# Patient Record
Sex: Female | Born: 2000 | Race: White | Hispanic: No | Marital: Single | Smoking: Never smoker
Health system: Southern US, Community
[De-identification: ages and names within clinical notes are randomized; demographics above are authoritative.]

## PROBLEM LIST (undated history)

## (undated) DIAGNOSIS — E119 Type 2 diabetes mellitus without complications: Secondary | ICD-10-CM

## (undated) HISTORY — DX: Type 2 diabetes mellitus without complications: E11.9

---
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
## 2018-06-05 ENCOUNTER — Encounter: Payer: Self-pay | Admitting: Certified Nurse Midwife

## 2018-06-05 ENCOUNTER — Ambulatory Visit: Payer: Managed Care, Other (non HMO) | Admitting: Certified Nurse Midwife

## 2018-06-05 ENCOUNTER — Other Ambulatory Visit: Payer: Self-pay

## 2018-06-05 VITALS — BP 128/86 | HR 88 | Resp 16 | Ht 63.75 in | Wt 132.6 lb

## 2018-06-05 DIAGNOSIS — N898 Other specified noninflammatory disorders of vagina: Secondary | ICD-10-CM

## 2018-06-05 DIAGNOSIS — R319 Hematuria, unspecified: Secondary | ICD-10-CM

## 2018-06-05 DIAGNOSIS — N390 Urinary tract infection, site not specified: Secondary | ICD-10-CM

## 2018-06-05 DIAGNOSIS — Z113 Encounter for screening for infections with a predominantly sexual mode of transmission: Secondary | ICD-10-CM | POA: Diagnosis not present

## 2018-06-05 DIAGNOSIS — R300 Dysuria: Secondary | ICD-10-CM

## 2018-06-05 LAB — POCT URINALYSIS DIPSTICK
BILIRUBIN UA: NEGATIVE
GLUCOSE UA: POSITIVE — AB
Leukocytes, UA: NEGATIVE
Nitrite, UA: NEGATIVE
Protein, UA: NEGATIVE
Urobilinogen, UA: 0.2 E.U./dL
pH, UA: 5 (ref 5.0–8.0)

## 2018-06-05 MED ORDER — NITROFURANTOIN MONOHYD MACRO 100 MG PO CAPS: 100.0000 mg | ORAL_CAPSULE | Freq: Two times a day (BID) | ORAL | 0 refills | Status: DC

## 2018-06-05 NOTE — Progress Notes (Signed)
18 y.o. Single Caucasian female G0P0000 here with complaint of UTI, with onset  on the past 2 days.. Patient complaining of urinary frequency and vaginal irritation and slight  pain with urination. Patient denies fever, chills, nausea or back pain. No new personal products. Patient feels related to sexual activity with new partner. Complaining of vaginal irritation with increase white discharge. No odor. Desires STD screening.  Contraception is [place] IUD.Jillian Barton Patient feels she is not consuming adequate water intake. Drinking increased diet cokes. Patient is insulin dependent diabetic and struggling with managing at college now([employer]). Forgot to take afternoon  Insulin and feel this is why glucose in urine is high, also ate small snack only. Aware of what she needs to do and is working on a routine. No other health issues today.  Review of Systems  Genitourinary: Positive for dysuria, frequency and urgency.  All other systems reviewed and are negative.  Urine RBC: small, Ketones moderate, Glucose 1000+ O: Healthy female WDWN Affect: Normal, orientation x 3 Skin : warm and dry CVAT: negative bilateral Abdomen: very slight  suprapubic tenderness  Pelvic exam: External genital area:slight increase pink on vulva, no scaling or exudate no lesions Bladder,Urethra non tender, Urethral meatus: tender, redness noted Vagina: white non odorous vaginal discharge, normal appearance  Affirm takne Cervix: normal, non tender Uterus:normal,non tender Adnexa: normal non tender, no fullness or masses   A: Suspect post coital UTI Contraception Kyleena IUD R/O vaginal infection STD screening Normal pelvic exam Insulin dependent diabetic working on staying stable with college schedule  P: Reviewed findings of UTI suspect coital related and need for treatment, at this point due to finding. Be sure and empty bladder before and after sexual activity to prevent UTI occurrence. Condom use recommended GB:[id number] 100  mg bid x 7 see order with instructions VPX:TGGYI micro, culture Reviewed warning signs and symptoms of UTI and need to advise if occurring. Encouraged to limit soda, tea, and coffee and be sure to increase water intake. Lab: Affirm will treat if indicated Lab: Gc,chlamydia  Rv prn

## 2018-06-05 NOTE — Patient Instructions (Signed)
Urinary Tract Infection, Adult A urinary tract infection (UTI) is an infection of any part of the urinary tract. The urinary tract includes:  The kidneys.  The ureters.  The bladder.  The urethra. These organs make, store, and get rid of pee (urine) in the body. What are the causes? This is caused by germs (bacteria) in your genital area. These germs grow and cause swelling (inflammation) of your urinary tract. What increases the risk? You are more likely to develop this condition if:  You have a small, thin tube (catheter) to drain pee.  You cannot control when you pee or poop (incontinence).  You are female, and: ? You use these methods to prevent pregnancy: ? A medicine that kills sperm (spermicide). ? A device that blocks sperm (diaphragm). ? You have low levels of a female hormone (estrogen). ? You are pregnant.  You have genes that add to your risk.  You are sexually active.  You take antibiotic medicines.  You have trouble peeing because of: ? A prostate that is bigger than normal, if you are female. ? A blockage in the part of your body that drains pee from the bladder (urethra). ? A kidney stone. ? A nerve condition that affects your bladder (neurogenic bladder). ? Not getting enough to drink. ? Not peeing often enough.  You have other conditions, such as: ? Diabetes. ? A weak disease-fighting system (immune system). ? Sickle cell disease. ? Gout. ? Injury of the spine. What are the signs or symptoms? Symptoms of this condition include:  Needing to pee right away (urgently).  Peeing often.  Peeing small amounts often.  Pain or burning when peeing.  Blood in the pee.  Pee that smells bad or not like normal.  Trouble peeing.  Pee that is cloudy.  Fluid coming from the vagina, if you are female.  Pain in the belly or lower back. Other symptoms include:  Throwing up (vomiting).  No urge to eat.  Feeling mixed up (confused).  Being tired  and grouchy (irritable).  A fever.  Watery poop (diarrhea). How is this treated? This condition may be treated with:  Antibiotic medicine.  Other medicines.  Drinking enough water. Follow these instructions at home:  Medicines  Take over-the-counter and prescription medicines only as told by your doctor.  If you were prescribed an antibiotic medicine, take it as told by your doctor. Do not stop taking it even if you start to feel better. General instructions  Make sure you: ? Pee until your bladder is empty. ? Do not hold pee for a long time. ? Empty your bladder after sex. ? Wipe from front to back after pooping if you are a female. Use each tissue one time when you wipe.  Drink enough fluid to keep your pee pale yellow.  Keep all follow-up visits as told by your doctor. This is important. Contact a doctor if:  You do not get better after 1-2 days.  Your symptoms go away and then come back. Get help right away if:  You have very bad back pain.  You have very bad pain in your lower belly.  You have a fever.  You are sick to your stomach (nauseous).  You are throwing up. Summary  A urinary tract infection (UTI) is an infection of any part of the urinary tract.  This condition is caused by germs in your genital area.  There are many risk factors for a UTI. These include having a small, thin   tube to drain pee and not being able to control when you pee or poop.  Treatment includes antibiotic medicines for germs.  Drink enough fluid to keep your pee pale yellow. This information is not intended to replace advice given to you by your health care provider. Make sure you discuss any questions you have with your health care provider. Document Released: 10/18/2007 Document Revised: 11/08/2017 Document Reviewed: 11/08/2017 Elsevier Interactive Patient Education  2019 Elsevier Inc.  

## 2018-06-06 ENCOUNTER — Telehealth: Payer: Self-pay

## 2018-06-06 LAB — VAGINITIS/VAGINOSIS, DNA PROBE
Candida Species: NEGATIVE
Gardnerella vaginalis: NEGATIVE
Trichomonas vaginosis: NEGATIVE

## 2018-06-06 LAB — URINALYSIS, MICROSCOPIC ONLY
Bacteria, UA: NONE SEEN
Casts: NONE SEEN /lpf

## 2018-06-06 NOTE — Telephone Encounter (Signed)
-----   Message from Verner Chol, CNM sent at 06/06/2018 12:22 PM EST ----- Notify patient her vaginal screen was negative for infection with yeast, BV or trichomonas Urine culture pending Patient status and is she taking her insulin

## 2018-06-06 NOTE — Telephone Encounter (Signed)
Left message for call back.

## 2018-06-06 NOTE — Telephone Encounter (Signed)
Patient returning call.

## 2018-06-06 NOTE — Telephone Encounter (Signed)
Patient notified of results. See lab 

## 2018-06-07 LAB — GC/CHLAMYDIA PROBE AMP
CHLAMYDIA, DNA PROBE: NEGATIVE
NEISSERIA GONORRHOEAE BY PCR: NEGATIVE

## 2018-06-07 LAB — URINE CULTURE

## 2019-02-21 ENCOUNTER — Telehealth: Payer: Self-pay | Admitting: Obstetrics & Gynecology

## 2019-02-21 NOTE — Telephone Encounter (Signed)
Patient is having vaginal irritation and would like to see Dr.Miller if possible. To triage to assist with scheduling.

## 2019-02-21 NOTE — Telephone Encounter (Signed)
Spoke with patient. Patient reports external vaginal irritation with itching. Kyleena IUD, has not had a regular menses since the summer, reports spotting only. Increase in menses cramps, denies pain at this time, cramping is intermittent. Denies urinary symptoms, fever/chills, N/V. Covid 19 prescreen negative. States she has not been sexually active since " early springtime". Requesting OV. Patient is agreeable to seeing covering provider, OV scheduled for 10/12 at 11:30am with Dr. Talbert Nan. Advised provider will review, our office will return call if any additional recommendations. Patient agreeable.   Routing to provider for final review. Patient is agreeable to disposition. Will close encounter.  Cc: Dr. Talbert Nan

## 2019-02-21 NOTE — Telephone Encounter (Signed)
Call to patient, left detailed message, ok per dpr. Advised appt for 10/12 changed from 11:30am to 2pm with Dr. Talbert Nan. Please contact the office if unable to keep appt.

## 2019-02-24 ENCOUNTER — Ambulatory Visit: Payer: Managed Care, Other (non HMO) | Admitting: Obstetrics and Gynecology

## 2019-02-24 ENCOUNTER — Other Ambulatory Visit: Payer: Self-pay

## 2019-02-24 ENCOUNTER — Encounter: Payer: Self-pay | Admitting: Obstetrics and Gynecology

## 2019-02-24 VITALS — BP 112/82 | HR 68 | Temp 97.2°F | Wt 134.8 lb

## 2019-02-24 DIAGNOSIS — Z30431 Encounter for routine checking of intrauterine contraceptive device: Secondary | ICD-10-CM

## 2019-02-24 DIAGNOSIS — N946 Dysmenorrhea, unspecified: Secondary | ICD-10-CM | POA: Diagnosis not present

## 2019-02-24 DIAGNOSIS — N939 Abnormal uterine and vaginal bleeding, unspecified: Secondary | ICD-10-CM

## 2019-02-24 DIAGNOSIS — B373 Candidiasis of vulva and vagina: Secondary | ICD-10-CM

## 2019-02-24 DIAGNOSIS — B3731 Acute candidiasis of vulva and vagina: Secondary | ICD-10-CM

## 2019-02-24 LAB — POCT URINE PREGNANCY: Preg Test, Ur: NEGATIVE

## 2019-02-24 MED ORDER — FLUCONAZOLE 150 MG PO TABS: 150.0000 mg | ORAL_TABLET | Freq: Once | ORAL | 0 refills | Status: AC

## 2019-02-24 MED ORDER — BETAMETHASONE VALERATE 0.1 % EX OINT: 1.0000 "application " | TOPICAL_OINTMENT | Freq: Two times a day (BID) | CUTANEOUS | 0 refills | Status: DC

## 2019-02-24 NOTE — Patient Instructions (Signed)
Vaginal Yeast infection, Adult  Vaginal yeast infection is a condition that causes vaginal discharge as well as soreness, swelling, and redness (inflammation) of the vagina. This is a common condition. Some women get this infection frequently. What are the causes? This condition is caused by a change in the normal balance of the yeast (candida) and bacteria that live in the vagina. This change causes an overgrowth of yeast, which causes the inflammation. What increases the risk? The condition is more likely to develop in women who:  Take antibiotic medicines.  Have diabetes.  Take birth control pills.  Are pregnant.  Douche often.  Have a weak body defense system (immune system).  Have been taking steroid medicines for a long time.  Frequently wear tight clothing. What are the signs or symptoms? Symptoms of this condition include:  White, thick, creamy vaginal discharge.  Swelling, itching, redness, and irritation of the vagina. The lips of the vagina (vulva) may be affected as well.  Pain or a burning feeling while urinating.  Pain during sex. How is this diagnosed? This condition is diagnosed based on:  Your medical history.  A physical exam.  A pelvic exam. Your health care provider will examine a sample of your vaginal discharge under a microscope. Your health care provider may send this sample for testing to confirm the diagnosis. How is this treated? This condition is treated with medicine. Medicines may be over-the-counter or prescription. You may be told to use one or more of the following:  Medicine that is taken by mouth (orally).  Medicine that is applied as a cream (topically).  Medicine that is inserted directly into the vagina (suppository). Follow these instructions at home:  Lifestyle  Do not have sex until your health care provider approves. Tell your sex partner that you have a yeast infection. That person should go to his or her health care  provider and ask if they should also be treated.  Do not wear tight clothes, such as pantyhose or tight pants.  Wear breathable cotton underwear. General instructions  Take or apply over-the-counter and prescription medicines only as told by your health care provider.  Eat more yogurt. This may help to keep your yeast infection from returning.  Do not use tampons until your health care provider approves.  Try taking a sitz bath to help with discomfort. This is a warm water bath that is taken while you are sitting down. The water should only come up to your hips and should cover your buttocks. Do this 3-4 times per day or as told by your health care provider.  Do not douche.  If you have diabetes, keep your blood sugar levels under control.  Keep all follow-up visits as told by your health care provider. This is important. Contact a health care provider if:  You have a fever.  Your symptoms go away and then return.  Your symptoms do not get better with treatment.  Your symptoms get worse.  You have new symptoms.  You develop blisters in or around your vagina.  You have blood coming from your vagina and it is not your menstrual period.  You develop pain in your abdomen. Summary  Vaginal yeast infection is a condition that causes discharge as well as soreness, swelling, and redness (inflammation) of the vagina.  This condition is treated with medicine. Medicines may be over-the-counter or prescription.  Take or apply over-the-counter and prescription medicines only as told by your health care provider.  Do not douche.   Do not have sex or use tampons until your health care provider approves.  Contact a health care provider if your symptoms do not get better with treatment or your symptoms go away and then return. This information is not intended to replace advice given to you by your health care provider. Make sure you discuss any questions you have with your health care  provider. Document Released: 02/08/2005 Document Revised: 09/17/2017 Document Reviewed: 09/17/2017 Elsevier Patient Education  2020 Elsevier Inc.  

## 2019-02-24 NOTE — Progress Notes (Signed)
GYNECOLOGY  VISIT   HPI: 18 y.o.   Single White or Caucasian Not Hispanic or Latino  female   G0P0000 with No LMP recorded. (Menstrual status: IUD).   here for irregular bleeding and cramping with IUD. The patient has a [place] IUD, placed in 6/18. She has spotting most months, can last for 5 days. She has cramps for a few days prior to bleeding, mostly tolerable, occasional bad. 1 day of the month she has waves of cramping that gets to a 7-8/10 in severity. She had really severe cramps prior to the IUD, overall the cramps with the IUD are better than without an IUD. They have progressively gotten a little worse, her eating habits and activity level have changed with covid. Sexually active, but not for 6 months, always uses condoms.   Reports vaginal irritation intermittently for months, seems cyclic. No abnormal vaginal d/c, feels itchy and raw, no odor.   Type I DM, reports last HgbA1C ~7  GYNECOLOGIC HISTORY: No LMP recorded. (Menstrual status: IUD). Contraception: IUD Menopausal hormone therapy: IUD        OB History    Gravida  0   Para  0   Term  0   Preterm  0   AB  0   Living  0     SAB  0   TAB  0   Ectopic  0   Multiple  0   Live Births  0              Patient Active Problem List   Diagnosis Date Noted  . IUD (intrauterine device) in place 12/15/2016  . Type 1 diabetes mellitus without complications ([place]) 35/57/3220    Past Medical History:  Diagnosis Date  . Diabetes mellitus without complication ([place]) 25/4270   Type I- has insulin pump    History reviewed. No pertinent surgical history.  Current Outpatient Medications  Medication Sig Dispense Refill  . B-D ULTRA-FINE 33 LANCETS MISC Use to test BG 6 x per day (90 Day Supply)    . Blood Glucose Monitoring Suppl (GLUCOCOM BLOOD GLUCOSE MONITOR) DEVI Use for blood ketone monitoring    . busPIRone (BUSPAR) 5 MG tablet Take 5 mg by mouth 2 (two) times daily.    Marland Kitchen escitalopram (LEXAPRO) 10 MG  tablet Take 1 tablet by mouth daily.    Marland Kitchen glucagon (GLUCAGON EMERGENCY) 1 MG injection Use in case of hypoglycemic emergency as directed.    . Insulin Glargine (BASAGLAR KWIKPEN) 100 UNIT/ML SOPN Inject up to 30 units daily as directed.    . insulin lispro (HUMALOG) 100 UNIT/ML injection USE UP TO 90 UNITS DAILY AS DIRECTED IN INSULIN PUMP    . Ketone Blood Test (PRECISION XTRA) STRP Use to test blood for ketones as directed    . Levonorgestrel (KYLEENA IU) by Intrauterine route.    Marland Kitchen glucose blood (FREESTYLE TEST STRIPS) test strip Use to test BG up to 6 times daily (90 Day Supply)     No current facility-administered medications for this visit.      ALLERGIES: Patient has no known allergies.  Family History  Problem Relation Age of Onset  . Dementia Maternal Grandmother   . Crohn's disease Maternal Grandfather     Social History   Socioeconomic History  . Marital status: Single    Spouse name: Not on file  . Number of children: Not on file  . Years of education: Not on file  . Highest education level: Not on  file  Occupational History  . Not on file  Social Needs  . Financial resource strain: Not on file  . Food insecurity    Worry: Not on file    Inability: Not on file  . Transportation needs    Medical: Not on file    Non-medical: Not on file  Tobacco Use  . Smoking status: Never Smoker  . Smokeless tobacco: Never Used  Substance and Sexual Activity  . Alcohol use: No  . Drug use: No  . Sexual activity: Not Currently    Birth control/protection: I.U.D.    Comment: kyleena placed 10/17/16  Lifestyle  . Physical activity    Days per week: Not on file    Minutes per session: Not on file  . Stress: Not on file  Relationships  . Social Musician on phone: Not on file    Gets together: Not on file    Attends religious service: Not on file    Active member of club or organization: Not on file    Attends meetings of clubs or organizations: Not on file     Relationship status: Not on file  . Intimate partner violence    Fear of current or ex partner: Not on file    Emotionally abused: Not on file    Physically abused: Not on file    Forced sexual activity: Not on file  Other Topics Concern  . Not on file  Social History Narrative  . Not on file    Review of Systems  Constitutional: Negative.   HENT: Negative.   Eyes: Negative.   Respiratory: Negative.   Cardiovascular: Negative.   Gastrointestinal: Negative.   Genitourinary:       Vaginal irritation AUB Uterine cramping  Musculoskeletal: Negative.   Skin: Negative.   Neurological: Negative.   Endo/Heme/Allergies: Negative.   Psychiatric/Behavioral: Negative.     PHYSICAL EXAMINATION:    BP 112/82 (BP Location: Right Arm, Patient Position: Sitting, Cuff Size: Normal)   Pulse 68   Temp (!) 97.2 F (36.2 C) (Temporal)   Wt 134 lb 12.8 oz (61.1 kg)   BMI 23.32 kg/m     General appearance: alert, cooperative and appears stated age Abdomen: soft, non-tender; non distended, no masses,  no organomegaly  Pelvic: External genitalia:  no lesions, + erythema and swelling diffusely              Urethra:  normal appearing urethra with no masses, tenderness or lesions              Bartholins and Skenes: normal                 Vagina: normal appearing vagina with a slight increase in vaginal d/c              Cervix: no cervical motion tenderness, no lesions and IUD string 2-3 cm              Bimanual Exam:  Uterus:  normal size, contour, position, consistency, mobility, non-tender              Adnexa: no mass, fullness, tenderness               Chaperone was present for exam.  ASSESSMENT Yeast vaginitis Some recent increase in dysmenorrhea Irregular spotting   PLAN UPT negative Treat with diflucan and steroid ointment She will call if she wants a script for Anaprox (currenlty aleve is working).   An After Visit  Summary was printed and given to the patient.  ~20  minutes face to face time of which over 50% was spent in counseling.   Cc: Dr Hyacinth MeekerMiller

## 2019-04-09 ENCOUNTER — Telehealth: Payer: Self-pay | Admitting: Obstetrics & Gynecology

## 2019-04-09 MED ORDER — FLUCONAZOLE 150 MG PO TABS: 150.0000 mg | ORAL_TABLET | Freq: Once | ORAL | 0 refills | Status: AC

## 2019-04-09 NOTE — Telephone Encounter (Signed)
Call reviewed with Jillian Barton, CNM. Rx for Diflucan 150 mg PO x1 now. #1/0RF. [place] sitz bath for symptom relief. If symptoms do not resolve or symptoms worsen, OV for further evaluation.   Call to patient, left detailed message, ok per dpr. Advised per Jillian Barton, CNM. Rx to [facility] on file. Return call to office if any additional questions.   Routing to provider for final review. Patient is agreeable to disposition. Will close encounter.

## 2019-04-09 NOTE — Telephone Encounter (Signed)
Spoke with pt. Pt states still having irritation with redness, itching and white, milky discharge. Pt denies odor. Pt seen on 02/24/19 for yeast infection and given Diflucan, states meds worked then but keeps coming back. Last diflucan taken on 02/27/19. Pt states has been recently sexually active and not worried about STDs because using condoms and has 1 partner. Does use scented body wash and advised pt to use only plain white dove soap for less irritation. Pt agreeable.  Will route to D. Hollice Espy, CNM for recommendations and to be seen.

## 2019-04-09 NOTE — Telephone Encounter (Signed)
Patient would like to speak with a nurse regarding the return of yeast infection symptoms.

## 2019-05-26 ENCOUNTER — Other Ambulatory Visit: Payer: Self-pay

## 2019-05-27 ENCOUNTER — Encounter: Payer: Self-pay | Admitting: Certified Nurse Midwife

## 2019-05-27 ENCOUNTER — Ambulatory Visit: Payer: Managed Care, Other (non HMO) | Admitting: Certified Nurse Midwife

## 2019-05-27 ENCOUNTER — Other Ambulatory Visit: Payer: Self-pay

## 2019-05-27 VITALS — BP 114/80 | HR 64 | Temp 97.6°F | Resp 16 | Wt 128.0 lb

## 2019-05-27 DIAGNOSIS — N898 Other specified noninflammatory disorders of vagina: Secondary | ICD-10-CM

## 2019-05-27 DIAGNOSIS — Z113 Encounter for screening for infections with a predominantly sexual mode of transmission: Secondary | ICD-10-CM

## 2019-05-27 NOTE — Patient Instructions (Signed)
Safe Sex Practicing safe sex means taking steps before and during sex to reduce your risk of:  Getting an STI (sexually transmitted infection).  Giving your partner an STI.  Unwanted or unplanned pregnancy. How can I practice safe sex?     Ways you can practice safe sex  Limit your sexual partners to only one partner who is having sex with only you.  Avoid using alcohol and drugs before having sex. Alcohol and drugs can affect your judgment.  Before having sex with a new partner: ? Talk to your partner about past partners, past STIs, and drug use. ? Get screened for STIs and discuss the results with your partner. Ask your partner to get screened, too.  Check your body regularly for sores, blisters, rashes, or unusual discharge. If you notice any of these problems, visit your health care provider.  Avoid sexual contact if you have symptoms of an infection or you are being treated for an STI.  While having sex, use a condom. Make sure to: ? Use a condom every time you have vaginal, oral, or anal sex. Both females and males should wear condoms during oral sex. ? Keep condoms in place from the beginning to the end of sexual activity. ? Use a latex condom, if possible. Latex condoms offer the best protection. ? Use only water-based lubricants with a condom. Using petroleum-based lubricants or oils will weaken the condom and increase the chance that it will break. Ways your health care provider can help you practice safe sex  See your health care provider for regular screenings, exams, and tests for STIs.  Talk with your health care provider about what kind of birth control (contraception) is best for you.  Get vaccinated against hepatitis B and human papillomavirus (HPV).  If you are at risk of being infected with HIV (human immunodeficiency virus), talk with your health care provider about taking a prescription medicine to prevent HIV infection. You are at risk for HIV if  you: ? Are a man who has sex with other men. ? Are sexually active with more than one partner. ? Take drugs by injection. ? Have a sex partner who has HIV. ? Have unprotected sex. ? Have sex with someone who has sex with both men and women. ? Have had an STI. Follow these instructions at home:  Take over-the-counter and prescription medicines as told by your health care provider.  Keep all follow-up visits as told by your health care provider. This is important. Where to find more information  Centers for Disease Control and Prevention: https://www.cdc.gov/std/prevention/default.htm  Planned Parenthood: https://www.plannedparenthood.org/  Office on Women's Health: https://www.womenshealth.gov/a-z-topics/sexually-transmitted-infections Summary  Practicing safe sex means taking steps before and during sex to reduce your risk of STIs, giving your partner STIs, and having an unwanted or unplanned pregnancy.  Before having sex with a new partner, talk to your partner about past partners, past STIs, and drug use.  Use a condom every time you have vaginal, oral, or anal sex. Both females and males should wear condoms during oral sex.  Check your body regularly for sores, blisters, rashes, or unusual discharge. If you notice any of these problems, visit your health care provider.  See your health care provider for regular screenings, exams, and tests for STIs. This information is not intended to replace advice given to you by your health care provider. Make sure you discuss any questions you have with your health care provider. Document Revised: 08/23/2018 Document Reviewed: 02/11/2018 Elsevier Patient Education    2020 [employer]. Vaginal Yeast Infection, Adult  Vaginal yeast infection is a condition that causes vaginal discharge as well as soreness, swelling, and redness (inflammation) of the vagina. This is a common condition. Some women get this infection frequently. What are the  causes? This condition is caused by a change in the normal balance of the yeast (candida) and bacteria that live in the vagina. This change causes an overgrowth of yeast, which causes the inflammation. What increases the risk? The condition is more likely to develop in women who:  Take antibiotic medicines.  Have diabetes.  Take birth control pills.  Are pregnant.  Douche often.  Have a weak body defense system (immune system).  Have been taking steroid medicines for a long time.  Frequently wear tight clothing. What are the signs or symptoms? Symptoms of this condition include:  White, thick, creamy vaginal discharge.  Swelling, itching, redness, and irritation of the vagina. The lips of the vagina (vulva) may be affected as well.  Pain or a burning feeling while urinating.  Pain during sex. How is this diagnosed? This condition is diagnosed based on:  Your medical history.  A physical exam.  A pelvic exam. Your health care provider will examine a sample of your vaginal discharge under a microscope. Your health care provider may send this sample for testing to confirm the diagnosis. How is this treated? This condition is treated with medicine. Medicines may be over-the-counter or prescription. You may be told to use one or more of the following:  Medicine that is taken by mouth (orally).  Medicine that is applied as a cream (topically).  Medicine that is inserted directly into the vagina (suppository). Follow these instructions at home:  Lifestyle  Do not have sex until your health care provider approves. Tell your sex partner that you have a yeast infection. That person should go to his or her health care provider and ask if they should also be treated.  Do not wear tight clothes, such as pantyhose or tight pants.  Wear breathable cotton underwear. General instructions  Take or apply over-the-counter and prescription medicines only as told by your health  care provider.  Eat more yogurt. This may help to keep your yeast infection from returning.  Do not use tampons until your health care provider approves.  Try taking a sitz bath to help with discomfort. This is a warm water bath that is taken while you are sitting down. The water should only come up to your hips and should cover your buttocks. Do this 3-4 times per day or as told by your health care provider.  Do not douche.  If you have diabetes, keep your blood sugar levels under control.  Keep all follow-up visits as told by your health care provider. This is important. Contact a health care provider if:  You have a fever.  Your symptoms go away and then return.  Your symptoms do not get better with treatment.  Your symptoms get worse.  You have new symptoms.  You develop blisters in or around your vagina.  You have blood coming from your vagina and it is not your menstrual period.  You develop pain in your abdomen. Summary  Vaginal yeast infection is a condition that causes discharge as well as soreness, swelling, and redness (inflammation) of the vagina.  This condition is treated with medicine. Medicines may be over-the-counter or prescription.  Take or apply over-the-counter and prescription medicines only as told by your health care provider.  Do not douche. Do not have sex or use tampons until your health care provider approves.  Contact a health care provider if your symptoms do not get better with treatment or your symptoms go away and then return. This information is not intended to replace advice given to you by your health care provider. Make sure you discuss any questions you have with your health care provider. Document Revised: 11/29/2018 Document Reviewed: 09/17/2017 Elsevier Patient Education  2020 [employer].

## 2019-05-27 NOTE — Progress Notes (Signed)
19 y.o. Single Caucasian female G0P0000 here with complaint of vaginal symptoms of itching, burning off and on over the past month or two in vaginal and vulva area.. Has been usually taking in about  80- 100 carbohydrates daily with diet. Insulin dependent diabetic. Considering pump use.Marland Kitchen Describes discharge as off white, no odor. Uses unscented Aveeno soap.Onset of symptoms for the past week. Denies new personal products. Uses condoms for STD prevention. Desires STD screening. Urinary symptoms none . Contraception is [place] IUD.  Review of Systems  Constitutional: Negative.   HENT: Negative.   Eyes: Negative.   Respiratory: Negative.   Cardiovascular: Negative.   Gastrointestinal: Negative.   Genitourinary: Negative.   Musculoskeletal: Negative.   Skin:       Vaginal itching,irritation,white discharge  Neurological: Negative.   Endo/Heme/Allergies: Negative.   Psychiatric/Behavioral: Negative.     O:Healthy female WDWN Affect: normal, orientation x 3  Exam:Skin: warm and dry Abdomen: soft,non tender  Inguinal Lymph nodes: no enlargement or tenderness Pelvic exam: External genital: normal female with redness noted in vulva area bilateral, slightly tender to touch, no lesions noted BUS: negative  Affirm taken Cervix: normal, non tender, no CMT, IUD string noted in cervix, appropriate string length Uterus: normal, non tender Adnexa:normal, non tender, no masses or fullness noted Rectal area: no lesions   A:Normal pelvic exam R/O vaginal infection STD screening Contraception [place] IUD Insulin dependent diabetic glucose levels elevated( she is working on now)   P:Discussed findings of vulva irritation and possible yeast etiology. Discussed Aveeno  sitz bath for comfort. Avoid moist clothes  for extended period of time. If working out in gym clothes  for long periods of time change underwear. Wear loose or no underwear at night. Avoid dryer sheet use with underwear, which can  cause irritation.  Use same type of condom to prevent irritation if working well. Questions addressed. Work on diet to keep glucose stable.  Lab: Affirm, GC,Chlamydia, HIV,RPR, Hep C  Rv prn

## 2019-05-28 ENCOUNTER — Telehealth: Payer: Self-pay

## 2019-05-28 LAB — VAGINITIS/VAGINOSIS, DNA PROBE
Candida Species: POSITIVE — AB
Gardnerella vaginalis: POSITIVE — AB
Trichomonas vaginosis: NEGATIVE

## 2019-05-28 LAB — RPR: RPR Ser Ql: NONREACTIVE

## 2019-05-28 LAB — HIV ANTIBODY (ROUTINE TESTING W REFLEX): HIV Screen 4th Generation wRfx: NONREACTIVE

## 2019-05-28 LAB — HEPATITIS C ANTIBODY: Hep C Virus Ab: 0.1 s/co ratio (ref 0.0–0.9)

## 2019-05-28 MED ORDER — FLUCONAZOLE 150 MG PO TABS: ORAL_TABLET | ORAL | 0 refills | Status: AC

## 2019-05-28 MED ORDER — METRONIDAZOLE 0.75 % VA GEL: 1.0000 | Freq: Every day | VAGINAL | 0 refills | Status: AC

## 2019-05-28 NOTE — Telephone Encounter (Signed)
Patient is returning call to Joy. °

## 2019-05-28 NOTE — Telephone Encounter (Signed)
-----   Message from Verner Chol, CNM sent at 05/28/2019  7:46 AM EST ----- Notify patient her HIV, RPR, Hep C is negative Vaginal screen was positive for yeast and BV She needs Rx for Metrogel one applicator every hs x 7 Rx Diflucan 150 mg one today and repeat one in 5 days Continue with tub baths with Aveeno sitz bath GC/chlamydia pending

## 2019-05-28 NOTE — Telephone Encounter (Signed)
Left message for call back.

## 2019-05-28 NOTE — Telephone Encounter (Signed)
Patient notified of results & rxs sent to pharmacy. 

## 2019-05-29 LAB — GC/CHLAMYDIA PROBE AMP
Chlamydia trachomatis, NAA: NEGATIVE
Neisseria Gonorrhoeae by PCR: NEGATIVE

## 2019-05-30 ENCOUNTER — Telehealth: Payer: Self-pay

## 2019-05-30 NOTE — Telephone Encounter (Signed)
Patient notified of results. See lab 

## 2019-05-30 NOTE — Telephone Encounter (Signed)
Left message for call back.

## 2019-05-30 NOTE — Telephone Encounter (Signed)
-----   Message from Verner Chol, CNM sent at 05/30/2019  7:48 AM EST ----- Notify patient her GC and chlamydia screening was negative

## 2019-06-27 ENCOUNTER — Ambulatory Visit: Payer: Self-pay

## 2019-07-21 ENCOUNTER — Telehealth: Payer: Self-pay | Admitting: *Deleted

## 2019-07-21 NOTE — Telephone Encounter (Signed)
Left message on voicemail to call and reschedule cancelled appointment. °

## 2019-08-06 ENCOUNTER — Encounter: Payer: Self-pay | Admitting: Certified Nurse Midwife

## 2019-08-14 ENCOUNTER — Ambulatory Visit: Payer: Managed Care, Other (non HMO) | Admitting: Obstetrics & Gynecology

## 2020-05-15 DIAGNOSIS — H43391 Other vitreous opacities, right eye: Secondary | ICD-10-CM | POA: Insufficient documentation

## 2020-12-29 DIAGNOSIS — F411 Generalized anxiety disorder: Secondary | ICD-10-CM | POA: Diagnosis not present

## 2021-01-05 DIAGNOSIS — F411 Generalized anxiety disorder: Secondary | ICD-10-CM | POA: Diagnosis not present

## 2021-01-12 DIAGNOSIS — F411 Generalized anxiety disorder: Secondary | ICD-10-CM | POA: Diagnosis not present

## 2021-01-19 DIAGNOSIS — F411 Generalized anxiety disorder: Secondary | ICD-10-CM | POA: Diagnosis not present

## 2021-01-24 DIAGNOSIS — R41840 Attention and concentration deficit: Secondary | ICD-10-CM | POA: Diagnosis not present

## 2021-01-24 DIAGNOSIS — F411 Generalized anxiety disorder: Secondary | ICD-10-CM | POA: Diagnosis not present

## 2021-01-24 DIAGNOSIS — F331 Major depressive disorder, recurrent, moderate: Secondary | ICD-10-CM | POA: Diagnosis not present

## 2021-02-02 DIAGNOSIS — F411 Generalized anxiety disorder: Secondary | ICD-10-CM | POA: Diagnosis not present

## 2021-02-09 DIAGNOSIS — F411 Generalized anxiety disorder: Secondary | ICD-10-CM | POA: Diagnosis not present

## 2021-02-16 DIAGNOSIS — F411 Generalized anxiety disorder: Secondary | ICD-10-CM | POA: Diagnosis not present

## 2021-02-18 DIAGNOSIS — R1110 Vomiting, unspecified: Secondary | ICD-10-CM | POA: Diagnosis not present

## 2021-02-18 DIAGNOSIS — E1065 Type 1 diabetes mellitus with hyperglycemia: Secondary | ICD-10-CM | POA: Diagnosis not present

## 2021-02-18 DIAGNOSIS — M791 Myalgia, unspecified site: Secondary | ICD-10-CM | POA: Diagnosis not present

## 2021-02-18 DIAGNOSIS — R739 Hyperglycemia, unspecified: Secondary | ICD-10-CM | POA: Diagnosis not present

## 2021-02-18 DIAGNOSIS — R000 Tachycardia, unspecified: Secondary | ICD-10-CM | POA: Diagnosis not present

## 2021-02-18 DIAGNOSIS — Z20822 Contact with and (suspected) exposure to covid-19: Secondary | ICD-10-CM | POA: Diagnosis not present

## 2021-02-18 DIAGNOSIS — E1010 Type 1 diabetes mellitus with ketoacidosis without coma: Secondary | ICD-10-CM | POA: Diagnosis not present

## 2021-02-18 DIAGNOSIS — Z9641 Presence of insulin pump (external) (internal): Secondary | ICD-10-CM | POA: Diagnosis not present

## 2021-02-18 DIAGNOSIS — E108 Type 1 diabetes mellitus with unspecified complications: Secondary | ICD-10-CM | POA: Diagnosis not present

## 2021-02-18 DIAGNOSIS — F419 Anxiety disorder, unspecified: Secondary | ICD-10-CM | POA: Insufficient documentation

## 2021-02-18 DIAGNOSIS — F32A Depression, unspecified: Secondary | ICD-10-CM | POA: Diagnosis not present

## 2021-02-18 DIAGNOSIS — E875 Hyperkalemia: Secondary | ICD-10-CM | POA: Diagnosis not present

## 2021-02-18 DIAGNOSIS — R638 Other symptoms and signs concerning food and fluid intake: Secondary | ICD-10-CM | POA: Diagnosis not present

## 2021-02-22 DIAGNOSIS — R41840 Attention and concentration deficit: Secondary | ICD-10-CM | POA: Diagnosis not present

## 2021-02-22 DIAGNOSIS — F3341 Major depressive disorder, recurrent, in partial remission: Secondary | ICD-10-CM | POA: Diagnosis not present

## 2021-02-22 DIAGNOSIS — F411 Generalized anxiety disorder: Secondary | ICD-10-CM | POA: Diagnosis not present

## 2021-02-23 DIAGNOSIS — Z794 Long term (current) use of insulin: Secondary | ICD-10-CM | POA: Diagnosis not present

## 2021-02-23 DIAGNOSIS — E109 Type 1 diabetes mellitus without complications: Secondary | ICD-10-CM | POA: Diagnosis not present

## 2021-02-25 DIAGNOSIS — F411 Generalized anxiety disorder: Secondary | ICD-10-CM | POA: Diagnosis not present

## 2021-03-02 DIAGNOSIS — F411 Generalized anxiety disorder: Secondary | ICD-10-CM | POA: Diagnosis not present

## 2021-03-16 DIAGNOSIS — F411 Generalized anxiety disorder: Secondary | ICD-10-CM | POA: Diagnosis not present

## 2021-03-30 DIAGNOSIS — F411 Generalized anxiety disorder: Secondary | ICD-10-CM | POA: Diagnosis not present

## 2021-04-05 DIAGNOSIS — R41840 Attention and concentration deficit: Secondary | ICD-10-CM | POA: Diagnosis not present

## 2021-04-05 DIAGNOSIS — F411 Generalized anxiety disorder: Secondary | ICD-10-CM | POA: Diagnosis not present

## 2021-04-05 DIAGNOSIS — F3341 Major depressive disorder, recurrent, in partial remission: Secondary | ICD-10-CM | POA: Diagnosis not present

## 2021-04-06 DIAGNOSIS — F411 Generalized anxiety disorder: Secondary | ICD-10-CM | POA: Diagnosis not present

## 2021-04-13 DIAGNOSIS — F411 Generalized anxiety disorder: Secondary | ICD-10-CM | POA: Diagnosis not present

## 2021-06-29 DIAGNOSIS — F411 Generalized anxiety disorder: Secondary | ICD-10-CM | POA: Diagnosis not present

## 2021-07-01 DIAGNOSIS — E109 Type 1 diabetes mellitus without complications: Secondary | ICD-10-CM | POA: Diagnosis not present

## 2021-07-01 DIAGNOSIS — Z6822 Body mass index (BMI) 22.0-22.9, adult: Secondary | ICD-10-CM | POA: Diagnosis not present

## 2021-07-07 DIAGNOSIS — E109 Type 1 diabetes mellitus without complications: Secondary | ICD-10-CM | POA: Diagnosis not present

## 2021-07-13 DIAGNOSIS — F411 Generalized anxiety disorder: Secondary | ICD-10-CM | POA: Diagnosis not present

## 2021-07-20 DIAGNOSIS — F411 Generalized anxiety disorder: Secondary | ICD-10-CM | POA: Diagnosis not present

## 2021-08-10 DIAGNOSIS — F411 Generalized anxiety disorder: Secondary | ICD-10-CM | POA: Diagnosis not present

## 2021-08-11 DIAGNOSIS — H43391 Other vitreous opacities, right eye: Secondary | ICD-10-CM | POA: Diagnosis not present

## 2021-08-11 DIAGNOSIS — E109 Type 1 diabetes mellitus without complications: Secondary | ICD-10-CM | POA: Diagnosis not present

## 2021-08-16 DIAGNOSIS — Z124 Encounter for screening for malignant neoplasm of cervix: Secondary | ICD-10-CM | POA: Diagnosis not present

## 2021-08-16 DIAGNOSIS — Z113 Encounter for screening for infections with a predominantly sexual mode of transmission: Secondary | ICD-10-CM | POA: Diagnosis not present

## 2021-08-17 DIAGNOSIS — F411 Generalized anxiety disorder: Secondary | ICD-10-CM | POA: Diagnosis not present

## 2021-08-18 DIAGNOSIS — F3341 Major depressive disorder, recurrent, in partial remission: Secondary | ICD-10-CM | POA: Diagnosis not present

## 2021-08-18 DIAGNOSIS — F411 Generalized anxiety disorder: Secondary | ICD-10-CM | POA: Diagnosis not present

## 2021-08-18 DIAGNOSIS — R41840 Attention and concentration deficit: Secondary | ICD-10-CM | POA: Diagnosis not present

## 2021-08-24 DIAGNOSIS — F411 Generalized anxiety disorder: Secondary | ICD-10-CM | POA: Diagnosis not present

## 2021-08-31 DIAGNOSIS — F411 Generalized anxiety disorder: Secondary | ICD-10-CM | POA: Diagnosis not present

## 2021-09-07 DIAGNOSIS — F411 Generalized anxiety disorder: Secondary | ICD-10-CM | POA: Diagnosis not present

## 2021-09-14 DIAGNOSIS — J029 Acute pharyngitis, unspecified: Secondary | ICD-10-CM | POA: Diagnosis not present

## 2021-09-14 DIAGNOSIS — R0981 Nasal congestion: Secondary | ICD-10-CM | POA: Diagnosis not present

## 2021-09-16 DIAGNOSIS — F411 Generalized anxiety disorder: Secondary | ICD-10-CM | POA: Diagnosis not present

## 2021-09-21 DIAGNOSIS — F411 Generalized anxiety disorder: Secondary | ICD-10-CM | POA: Diagnosis not present

## 2021-09-22 DIAGNOSIS — F411 Generalized anxiety disorder: Secondary | ICD-10-CM | POA: Diagnosis not present

## 2021-09-22 DIAGNOSIS — F3341 Major depressive disorder, recurrent, in partial remission: Secondary | ICD-10-CM | POA: Diagnosis not present

## 2021-09-22 DIAGNOSIS — F900 Attention-deficit hyperactivity disorder, predominantly inattentive type: Secondary | ICD-10-CM | POA: Diagnosis not present

## 2021-09-28 DIAGNOSIS — F411 Generalized anxiety disorder: Secondary | ICD-10-CM | POA: Diagnosis not present

## 2021-10-05 DIAGNOSIS — F411 Generalized anxiety disorder: Secondary | ICD-10-CM | POA: Diagnosis not present

## 2021-10-12 ENCOUNTER — Encounter (HOSPITAL_BASED_OUTPATIENT_CLINIC_OR_DEPARTMENT_OTHER): Payer: Self-pay | Admitting: Obstetrics & Gynecology

## 2021-10-12 ENCOUNTER — Ambulatory Visit (HOSPITAL_BASED_OUTPATIENT_CLINIC_OR_DEPARTMENT_OTHER): Payer: BC Managed Care – PPO | Admitting: Obstetrics & Gynecology

## 2021-10-12 VITALS — BP 147/94 | HR 97 | Ht 64.0 in | Wt 136.8 lb

## 2021-10-12 DIAGNOSIS — Z975 Presence of (intrauterine) contraceptive device: Secondary | ICD-10-CM | POA: Diagnosis not present

## 2021-10-12 DIAGNOSIS — F411 Generalized anxiety disorder: Secondary | ICD-10-CM | POA: Diagnosis not present

## 2021-10-12 DIAGNOSIS — Z30433 Encounter for removal and reinsertion of intrauterine contraceptive device: Secondary | ICD-10-CM | POA: Diagnosis not present

## 2021-10-12 MED ORDER — LEVONORGESTREL 19.5 MG IU IUD: INTRAUTERINE_SYSTEM | Freq: Once | INTRAUTERINE | Status: AC

## 2021-10-12 NOTE — Progress Notes (Unsigned)
21 y.o. G0P0000 Single Caucasian female presents for removal of Jillian Barton and re-insertion of Barton.  She is planning on using [place] Barton.  I have not seen pt in about 5 years but was a former patient.  Has Jillian Barton placed 10/17/2016.  Pt has been counseled about alternative forms of contraception including OCPs, progesterone options, and condoms.  She is always using condoms with intercourse at this point as well.  She feels Barton is the better option for her.  Pt has also been counseled about risks and benefits as well as complications.  Consent is obtained today.  All questions answered prior to start of procedure.   Recent STD testing:  Gc/Chl testing done 09/04/2021 at [facility].  I saw results on her phone.  Also had normal pap smear at student [facility].  I reviewed this result as well.  LMP:  No LMP recorded. (Menstrual status: Barton).  Patient Active Problem List   Diagnosis Date Noted   Barton (intrauterine device) in place 12/15/2016   Type 1 diabetes mellitus without complications ([place]) [id number]   Past Medical History:  Diagnosis Date   Diabetes mellitus without complication ([place]) 0000000   Type I- has insulin pump   Current Outpatient Medications on File Prior to Visit  Medication Sig Dispense Refill   B-D ULTRA-FINE 33 LANCETS MISC Use to test BG 6 x per day (90 Day Supply)     busPIRone (BUSPAR) 5 MG tablet Take 5 mg by mouth 2 (two) times daily.     Continuous Blood Gluc Sensor (DEXCOM G6 SENSOR) MISC Use per manufacturer recommendations and change every 10 days     Continuous Blood Gluc Transmit (DEXCOM G6 TRANSMITTER) MISC Use per manufacturer recommendation and change every 3 months     escitalopram (LEXAPRO) 10 MG tablet Take 1 tablet by mouth daily.     fluconazole (DIFLUCAN) 150 MG tablet Take 1 po & repeat in 5 days 2 tablet 0   Glucagon (BAQSIMI ONE PACK) 3 MG/DOSE POWD Place into the nose.     glucose blood test strip Use to test BG up to 6 times daily (90 Day  Supply)     Insulin Glargine (BASAGLAR KWIKPEN) 100 UNIT/ML SOPN Inject up to 30 units daily as directed.     insulin lispro (HUMALOG) 100 UNIT/ML injection USE UP TO 90 UNITS DAILY AS DIRECTED IN INSULIN PUMP     Insulin Pen Needle (BD PEN NEEDLE NANO U/F) 32G X 4 MM MISC Use with insulin pens 4-6 x per day     Ketone Blood Test STRP Use to test blood for ketones as directed     Levonorgestrel (Jillian IU) by Intrauterine route.     No current facility-administered medications on file prior to visit.   Patient has no known allergies.  Review of Systems  All other systems reviewed and are negative. Vitals:   10/12/21 1436  BP: (!) 147/94  Pulse: 97  Weight: 136 lb 12.8 oz (62.1 kg)  Height: 5\' 4"  (1.626 m)    Gen:  WNWF healthy female NAD Abdomen: soft, non-tender Groin:  no inguinal nodes palpated  Pelvic exam: Vulva:  normal female genitalia Vagina:  normal vagina Cervix:  Non-tender, Negative CMT, no lesions or redness. Uterus:  normal shape, position and consistency   Procedure:  Speculum reinserted.  Cervix visualized and cleansed with Betadine x 3.  Single toothed tenaculum applied to anterior lip of cervix without difficulty.  Barton string noted and grasped with  ringed forcep.  With one pull, Barton removed easily.  Pt has some mild cramping but tolerated this well.  Then uterus sounded to 7cm. Jillian Barton package was opened.  Barton and introducer passed to fundus and then withdrawn slightly before Barton was passed into endometrial cavity.  Introducer removed.  Strings cut to 2cm.  Tenaculum removed from cervix.  Minimal bleeding noted.  Pt tolerated the procedure well.  All instruments removed from vagina.  Assessment/Plan: 1. Encounter for Barton removal and reinsertion - Return for recheck 6-8 weeks - Pt aware to call for any concerns - Pt aware removal due no later than 5 years from date, 10/13/2026. Barton card given to pt.  Pt reminded this Barton is MR conditional and to discuss  with radiologist before any MRI.  2. Barton contraception - levonorgestrel (Jillian) 19.5 MG Barton

## 2021-10-19 DIAGNOSIS — F411 Generalized anxiety disorder: Secondary | ICD-10-CM | POA: Diagnosis not present

## 2021-10-20 DIAGNOSIS — F900 Attention-deficit hyperactivity disorder, predominantly inattentive type: Secondary | ICD-10-CM | POA: Diagnosis not present

## 2021-10-20 DIAGNOSIS — F411 Generalized anxiety disorder: Secondary | ICD-10-CM | POA: Diagnosis not present

## 2021-10-20 DIAGNOSIS — F3341 Major depressive disorder, recurrent, in partial remission: Secondary | ICD-10-CM | POA: Diagnosis not present

## 2021-10-26 DIAGNOSIS — F411 Generalized anxiety disorder: Secondary | ICD-10-CM | POA: Diagnosis not present

## 2021-11-09 DIAGNOSIS — F411 Generalized anxiety disorder: Secondary | ICD-10-CM | POA: Diagnosis not present

## 2021-11-16 DIAGNOSIS — F411 Generalized anxiety disorder: Secondary | ICD-10-CM | POA: Diagnosis not present

## 2022-01-03 DIAGNOSIS — F411 Generalized anxiety disorder: Secondary | ICD-10-CM | POA: Diagnosis not present

## 2022-01-03 DIAGNOSIS — F900 Attention-deficit hyperactivity disorder, predominantly inattentive type: Secondary | ICD-10-CM | POA: Diagnosis not present

## 2022-01-03 DIAGNOSIS — F3341 Major depressive disorder, recurrent, in partial remission: Secondary | ICD-10-CM | POA: Diagnosis not present

## 2022-01-04 DIAGNOSIS — F411 Generalized anxiety disorder: Secondary | ICD-10-CM | POA: Diagnosis not present

## 2022-01-17 DIAGNOSIS — F900 Attention-deficit hyperactivity disorder, predominantly inattentive type: Secondary | ICD-10-CM | POA: Diagnosis not present

## 2022-01-17 DIAGNOSIS — F411 Generalized anxiety disorder: Secondary | ICD-10-CM | POA: Diagnosis not present

## 2022-01-17 DIAGNOSIS — F330 Major depressive disorder, recurrent, mild: Secondary | ICD-10-CM | POA: Diagnosis not present

## 2022-02-01 DIAGNOSIS — F411 Generalized anxiety disorder: Secondary | ICD-10-CM | POA: Diagnosis not present

## 2022-02-08 DIAGNOSIS — F411 Generalized anxiety disorder: Secondary | ICD-10-CM | POA: Diagnosis not present

## 2022-02-15 DIAGNOSIS — F411 Generalized anxiety disorder: Secondary | ICD-10-CM | POA: Diagnosis not present

## 2022-02-22 DIAGNOSIS — F411 Generalized anxiety disorder: Secondary | ICD-10-CM | POA: Diagnosis not present

## 2022-02-28 DIAGNOSIS — E109 Type 1 diabetes mellitus without complications: Secondary | ICD-10-CM | POA: Diagnosis not present

## 2022-02-28 DIAGNOSIS — Z794 Long term (current) use of insulin: Secondary | ICD-10-CM | POA: Diagnosis not present

## 2022-03-01 DIAGNOSIS — F411 Generalized anxiety disorder: Secondary | ICD-10-CM | POA: Diagnosis not present

## 2022-03-02 DIAGNOSIS — Z1159 Encounter for screening for other viral diseases: Secondary | ICD-10-CM | POA: Diagnosis not present

## 2022-03-02 DIAGNOSIS — E109 Type 1 diabetes mellitus without complications: Secondary | ICD-10-CM | POA: Diagnosis not present

## 2022-03-02 DIAGNOSIS — R197 Diarrhea, unspecified: Secondary | ICD-10-CM | POA: Diagnosis not present

## 2022-03-02 DIAGNOSIS — F411 Generalized anxiety disorder: Secondary | ICD-10-CM | POA: Diagnosis not present

## 2022-03-02 DIAGNOSIS — Z23 Encounter for immunization: Secondary | ICD-10-CM | POA: Diagnosis not present

## 2022-03-02 DIAGNOSIS — F3341 Major depressive disorder, recurrent, in partial remission: Secondary | ICD-10-CM | POA: Diagnosis not present

## 2022-03-02 DIAGNOSIS — F900 Attention-deficit hyperactivity disorder, predominantly inattentive type: Secondary | ICD-10-CM | POA: Diagnosis not present

## 2022-03-02 DIAGNOSIS — Z113 Encounter for screening for infections with a predominantly sexual mode of transmission: Secondary | ICD-10-CM | POA: Diagnosis not present

## 2022-03-02 DIAGNOSIS — F419 Anxiety disorder, unspecified: Secondary | ICD-10-CM | POA: Diagnosis not present

## 2022-03-08 DIAGNOSIS — F411 Generalized anxiety disorder: Secondary | ICD-10-CM | POA: Diagnosis not present

## 2022-03-08 DIAGNOSIS — E109 Type 1 diabetes mellitus without complications: Secondary | ICD-10-CM | POA: Diagnosis not present

## 2022-03-22 DIAGNOSIS — F411 Generalized anxiety disorder: Secondary | ICD-10-CM | POA: Diagnosis not present

## 2022-03-28 DIAGNOSIS — F900 Attention-deficit hyperactivity disorder, predominantly inattentive type: Secondary | ICD-10-CM | POA: Diagnosis not present

## 2022-03-28 DIAGNOSIS — F411 Generalized anxiety disorder: Secondary | ICD-10-CM | POA: Diagnosis not present

## 2022-03-28 DIAGNOSIS — F3341 Major depressive disorder, recurrent, in partial remission: Secondary | ICD-10-CM | POA: Diagnosis not present

## 2022-03-29 DIAGNOSIS — F411 Generalized anxiety disorder: Secondary | ICD-10-CM | POA: Diagnosis not present

## 2022-04-12 DIAGNOSIS — F411 Generalized anxiety disorder: Secondary | ICD-10-CM | POA: Diagnosis not present
